# Patient Record
Sex: Male | Born: 1955 | Race: Black or African American | Hispanic: No | Marital: Single
Health system: Southern US, Community
[De-identification: ages and names within clinical notes are randomized; demographics above are authoritative.]

---

## 2005-11-14 ENCOUNTER — Other Ambulatory Visit: Payer: Self-pay

## 2005-11-14 ENCOUNTER — Emergency Department: Payer: Self-pay | Admitting: Emergency Medicine

## 2007-11-14 IMAGING — CR DG CHEST 2V
1 series · 2 of 2 positions shown · non-contrast
Comparison: none

REASON FOR EXAM: Shortness of breath
COMMENTS:

[Series 1: view not recorded · 0.17mm/px · 2 of 2 slices shown]
[im 1/2]
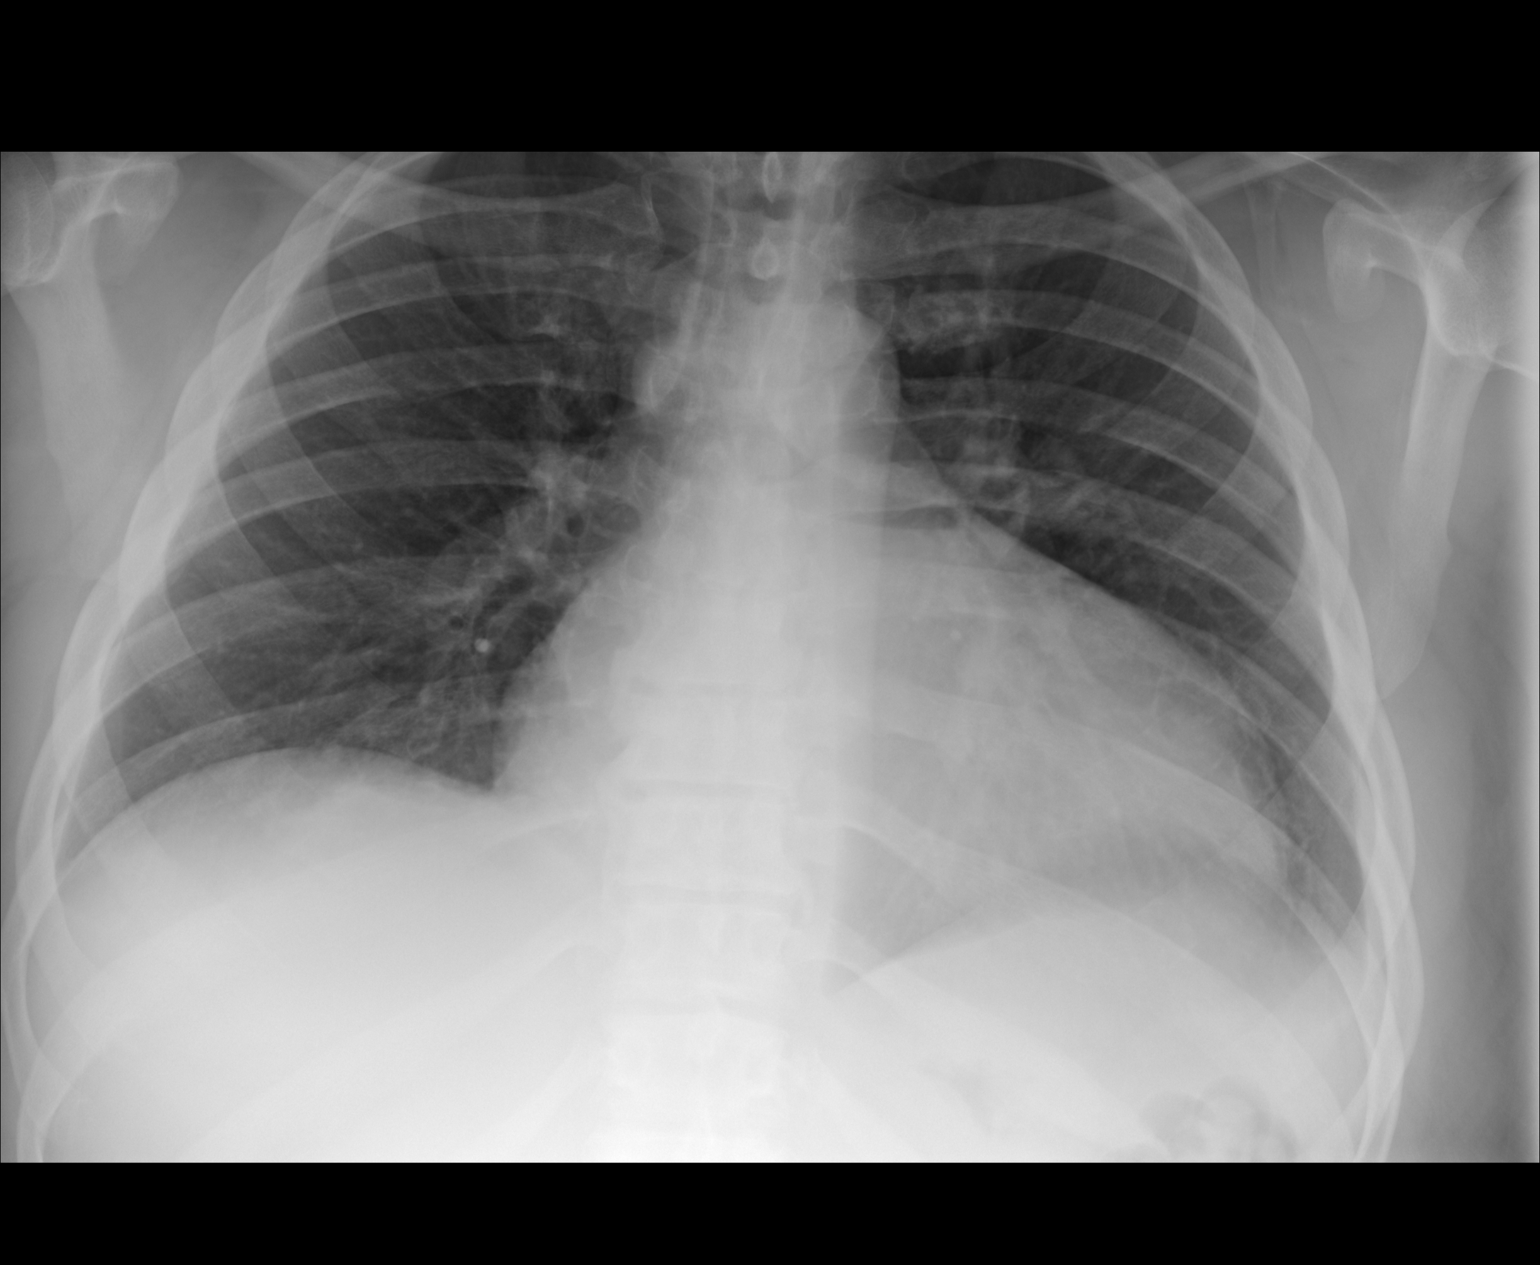
[im 2/2]
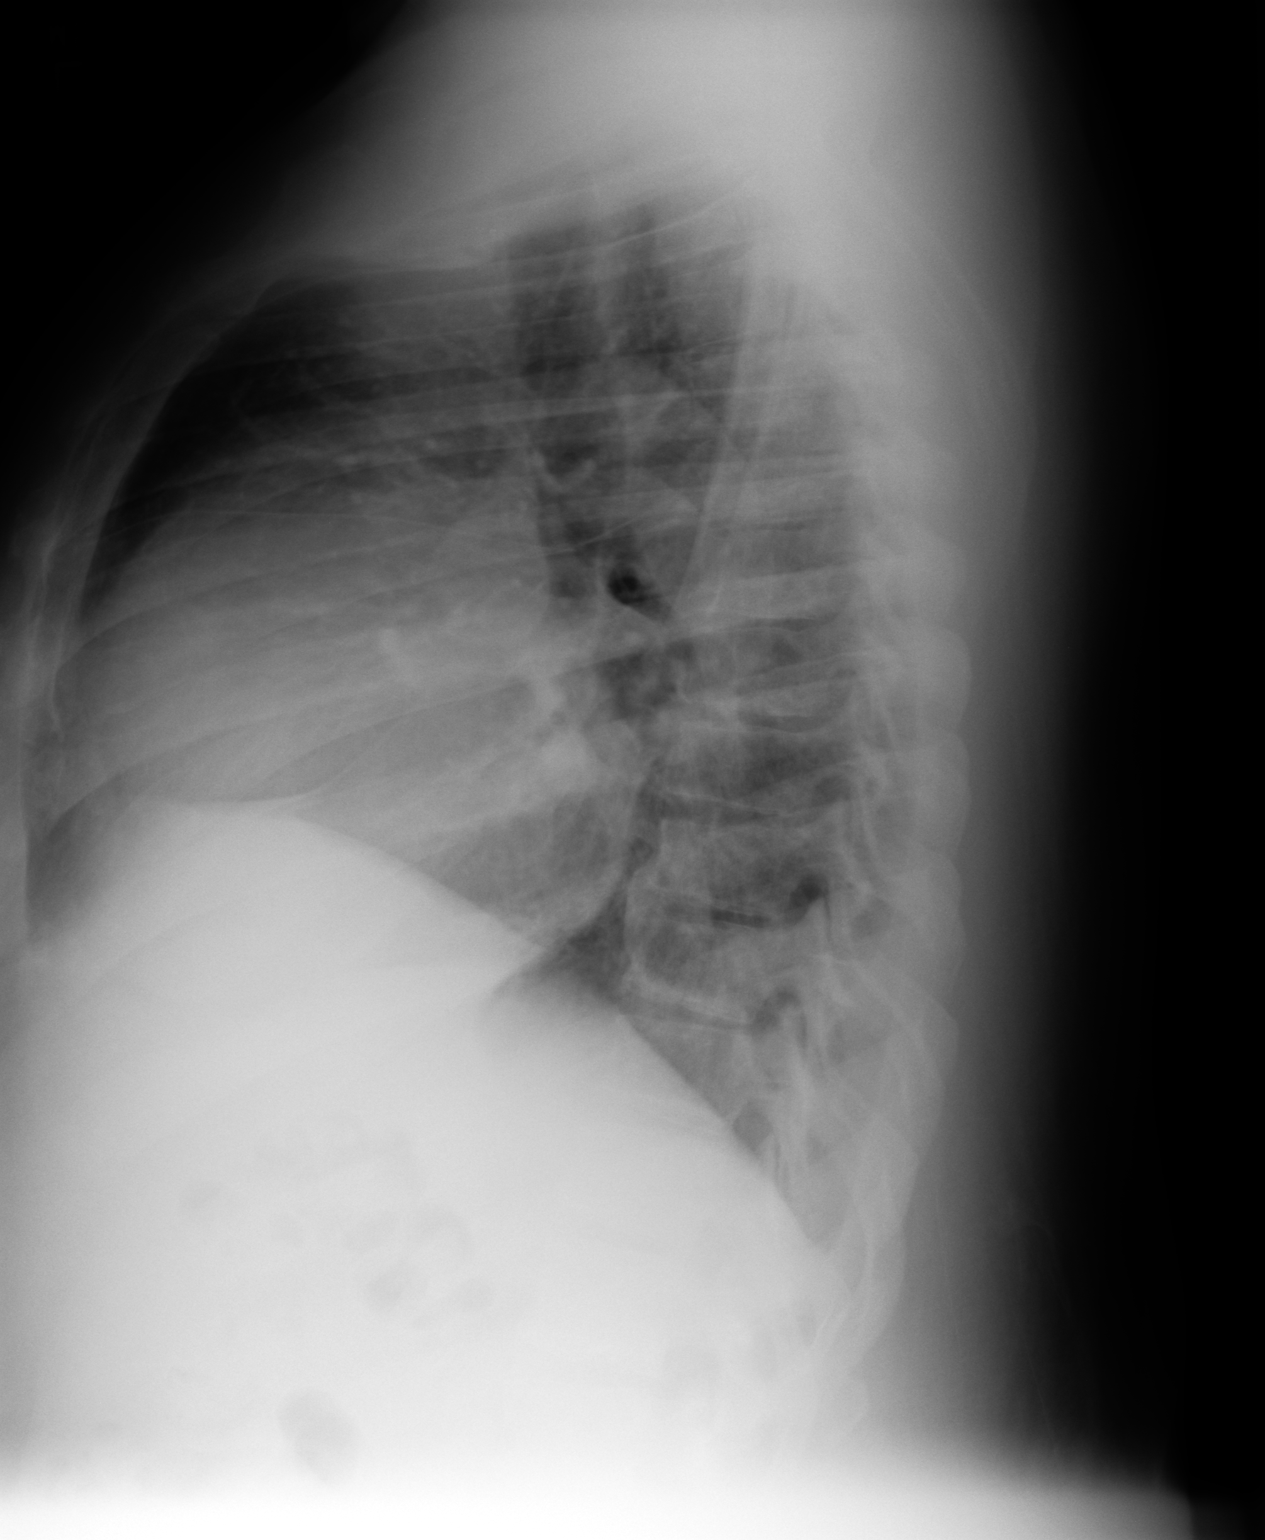

[2 of 2 positions shown; findings below may reference images not displayed]

PROCEDURE:     DXR - DXR CHEST PA (OR AP) AND LATERAL  - November 14, 2005  [DATE]

RESULT:     Two views of the chest are performed.  The patient has no prior
study for comparison.  The inspiratory effort is somewhat shallow.  The
cardiac silhouette is enlarged.  There is no infiltrate, effusion,
pneumothorax, or edema.
IMPRESSION: Cardiomegaly.

## 2015-03-18 ENCOUNTER — Emergency Department
Admission: EM | Admit: 2015-03-18 | Discharge: 2015-04-06 | Disposition: E | Payer: Self-pay | Attending: Emergency Medicine | Admitting: Emergency Medicine

## 2015-03-18 DIAGNOSIS — I469 Cardiac arrest, cause unspecified: Secondary | ICD-10-CM | POA: Insufficient documentation

## 2015-03-18 DIAGNOSIS — I422 Other hypertrophic cardiomyopathy: Secondary | ICD-10-CM | POA: Insufficient documentation

## 2015-03-18 DIAGNOSIS — I442 Atrioventricular block, complete: Secondary | ICD-10-CM

## 2015-03-18 LAB — BLOOD GAS, ARTERIAL
ACID-BASE DEFICIT: 20.5 mmol/L — AB (ref 0.0–2.0)
Allens test (pass/fail): POSITIVE — AB
Bicarbonate: 12.2 mEq/L — ABNORMAL LOW (ref 21.0–28.0)
FIO2: 1
Mechanical Rate: 20
O2 SAT: 70.7 %
PEEP/CPAP: 5 cmH2O
PH ART: 6.93 — AB (ref 7.350–7.450)
Patient temperature: 37
RATE: 20 resp/min
VT: 550 mL
pCO2 arterial: 58 mmHg — ABNORMAL HIGH (ref 32.0–48.0)
pO2, Arterial: 62 mmHg — ABNORMAL LOW (ref 83.0–108.0)

## 2015-03-18 LAB — GLUCOSE, CAPILLARY: Glucose-Capillary: 191 mg/dL — ABNORMAL HIGH (ref 65–99)

## 2015-03-18 MED ORDER — CALCIUM CHLORIDE 10 % IV SOLN
INTRAVENOUS | Status: DC | PRN
  Administered 2015-03-18: 1 g via INTRAVENOUS

## 2015-03-18 MED ORDER — DOPAMINE-DEXTROSE 3.2-5 MG/ML-% IV SOLN
INTRAVENOUS | Status: AC | PRN
  Administered 2015-03-18: 8.05 ug/kg/min via INTRAVENOUS

## 2015-03-18 MED ORDER — SODIUM BICARBONATE 8.4 % IV SOLN
INTRAVENOUS | Status: DC | PRN
  Administered 2015-03-18: 50 meq via INTRAVENOUS

## 2015-03-18 MED ORDER — SODIUM BICARBONATE 8.4 % IV SOLN
INTRAVENOUS | Status: AC | PRN
  Administered 2015-03-18 (×2): 50 meq via INTRAVENOUS

## 2015-03-18 MED ORDER — EPINEPHRINE HCL 0.1 MG/ML IJ SOSY
PREFILLED_SYRINGE | INTRAMUSCULAR | Status: AC
  Filled 2015-03-18: qty 40

## 2015-03-18 MED ORDER — ATROPINE SULFATE 1 MG/ML IJ SOLN
INTRAMUSCULAR | Status: AC | PRN
  Administered 2015-03-18 (×2): 1 mg via INTRAVENOUS

## 2015-03-18 MED ORDER — EPINEPHRINE HCL 0.1 MG/ML IJ SOSY
PREFILLED_SYRINGE | INTRAMUSCULAR | Status: AC | PRN
  Administered 2015-03-18 (×5): 1 mg via INTRAVENOUS

## 2015-03-18 MED ORDER — CALCIUM CHLORIDE 10 % IV SOLN
INTRAVENOUS | Status: AC | PRN
  Administered 2015-03-18: 1 g via INTRAVENOUS

## 2015-03-18 MED ORDER — EPINEPHRINE HCL 0.1 MG/ML IJ SOSY
PREFILLED_SYRINGE | INTRAMUSCULAR | Status: DC | PRN
  Administered 2015-03-18: 1 via INTRAVENOUS

## 2015-03-18 MED ORDER — ATROPINE SULFATE 0.1 MG/ML IJ SOLN
INTRAMUSCULAR | Status: AC
  Filled 2015-03-18: qty 20

## 2015-03-18 MED FILL — Medication: Qty: 2 | Status: AC

## 2015-04-06 NOTE — ED Notes (Signed)
Pt shocked with defibrillator. Resumed compressions.  

## 2015-04-06 NOTE — ED Notes (Signed)
BG 191.

## 2015-04-06 NOTE — ED Notes (Signed)
Dr. Jake Church answering service notified by this RN of patient's passing; spoke with Shanda Bumps. MD to be notified during business hours; no callback requested at this time.

## 2015-04-06 NOTE — Progress Notes (Signed)
CPR in progress, pt was bagged with AMBU via 100% FIO2, pt was intubated by Dr. Manson Passey, 7.5 ETT secured 24cm lip, positive ETCO2, BBS confirmed, CO2 detector reading 33 during resuscitation. Pt was placed on ventilator after pulse returned, ABG was drawn. Pt coded, CPR progressed until pt was pronounced by Dr.Brown.

## 2015-04-06 NOTE — ED Notes (Signed)
COPA called to advise that patient on hold for tissue and eyes only at this time. Asking that information be conveyed to [facility]. This RN called and spoke with Brothers, RN to make her aware of the aforementioned information; AC acknowledged.

## 2015-04-06 NOTE — ED Notes (Signed)
Defibrillator pads changed. Faint pulse, Pt bradycardic.

## 2015-04-06 NOTE — ED Notes (Signed)
Pt shocked with defibrillator. Resumed compressions.

## 2015-04-06 NOTE — ED Provider Notes (Signed)
[facility] Emergency Department Provider Note  ____________________________________________  Time seen: 12:30 AM  I have reviewed the triage vital signs and the nursing notes.  History of physical exam limited secondary to cardiac arrest HISTORY  Chief Complaint No chief complaint on file.      HPI Luke Marquez. is a 60 y.o. male presents in cardiac arrest via EMS. Per EMS on their arrival the patient was in tripod position with frothy sputum coming from his mouth. EMS states that the patient's mother heard the patient having difficulty breathing which is what prompted the call to 911. While in route EMS states that no pulses palpable.     There are no active problems to display for this patient.   No past surgical history on file.  No current outpatient prescriptions on file.  Allergies Review of patient's allergies indicates not on file.  No family history on file.  Social History Social History  Substance Use Topics  . Smoking status: Not on file  . Smokeless tobacco: Not on file  . Alcohol Use: Not on file    Review of Systems   Respiratory: Positive for shortness of breath. Per EMS   10-point ROS otherwise negative.  ____________________________________________   PHYSICAL EXAM:  VITAL SIGNS: ED Triage Vitals  Enc Vitals Group     BP 2015/03/29 0104 67/55 mmHg     Pulse Rate 03-29-2015 0041 23     Resp 03-29-2015 0056 20     Temp --      Temp src --      SpO2 --      Weight 03/29/15 0254 291 lb (131.997 kg)     Height --      Head Cir --      Peak Flow --      Pain Score --      Pain Loc --      Pain Edu? --      Excl. in GC? --      Constitutional: Unresponsive pulseless Eyes: Pupils fixed and dilated bilaterally ENT   Head: Normocephalic and atraumatic.   Nose: No congestion/rhinnorhea.   Mouth/Throat: Moderate amount of frothy sputum   Neck: No stridor. Hematological/Lymphatic/Immunilogical: No  cervical lymphadenopathy. Cardiovascular: No palpable carotid pulse, no audible heart sounds Respiratory: Bibasilar rales with BVM  Gastrointestinal: Soft and nontender. No distention.  Genitourinary: deferred Musculoskeletal: Nontender with normal range of motion in all extremities. No joint effusions.  No lower extremity tenderness nor edema. Neurologic: Unresponsive  Skin:  Skin is warm, dry and intact. No rash noted. Psychiatric: Mood and affect are normal. Speech and behavior are normal. Patient exhibits appropriate insight and judgment.  ____________________________________________    LABS (pertinent positives/negatives)  Labs Reviewed  BLOOD GAS, ARTERIAL - Abnormal; Notable for the following:    pH, Arterial 6.93 (*)    pCO2 arterial 58 (*)    pO2, Arterial 62 (*)    Bicarbonate 12.2 (*)    Acid-base deficit 20.5 (*)    Allens test (pass/fail) POSITIVE (*)    All other components within normal limits  GLUCOSE, CAPILLARY - Abnormal; Notable for the following:    Glucose-Capillary 191 (*)    All other components within normal limits  TROPONIN I  CBC WITH DIFFERENTIAL/PLATELET  COMPREHENSIVE METABOLIC PANEL       PROCEDURES  Procedure(s) performed: INTUBATION Performed by: Bayard Males N  Required items: required blood products, implants, devices, and special equipment available Patient identity confirmed: provided demographic data  and hospital-assigned identification number Time out: Immediately prior to procedure a "time out" was called to verify the correct patient, procedure, equipment, support staff and site/side marked as required.  Indications: Cardiac arrest   Intubation method: Direct Laryngoscopy   Preoxygenation:BVM  Sedatives: None  Paralytic: None   Tube Size: 7.5 cuffed  Post-procedure assessment: chest rise and ETCO2 monitor Breath sounds: equal and absent over the epigastrium Tube secured with: ETT holder Chest x-ray interpreted by  radiologist and me.    Patient tolerated the procedure well with no immediate complications.     Critical Care performed: CRITICAL CARE Performed by: Bayard Males N   Total critical care time: 60 minutes  Critical care time was exclusive of separately billable procedures and treating other patients.  Critical care was necessary to treat or prevent imminent or life-threatening deterioration.  Critical care was time spent personally by me on the following activities: development of treatment plan with patient and/or surrogate as well as nursing, discussions with consultants, evaluation of patient's response to treatment, examination of patient, obtaining history from patient or surrogate, ordering and performing treatments and interventions, ordering and review of laboratory studies, ordering and review of radiographic studies, pulse oximetry and re-evaluation of patient's condition.    ____________________________________________   INITIAL IMPRESSION / ASSESSMENT AND PLAN / ED COURSE  Pertinent labs & imaging results that were available during my care of the patient were reviewed by me and considered in my medical decision making (see chart for details). Patient was noted to be in complete heart block on the monitor on presentation to the emergency department as such with return of spontaneous circulation patient was transferred to continue C paced. Please refer to code sheet for documentation specifics. Patient had multiple episodes of V. fib with intervention based on ACLS guidelines however after approximately an hour of repetitive V. fib the patient rhythm changed to asystole with no further cardiac activity.  ____________________________________________   FINAL CLINICAL IMPRESSION(S) / ED DIAGNOSES  Final diagnoses:  Third degree heart block [facility])  Cardiac arrest [facility])      Darci Current, MD 03/21/15 [phone]

## 2015-04-06 DEATH — deceased
